# Patient Record
Sex: Male | Born: 1990 | Race: Asian | Hispanic: Yes | Marital: Single | Smoking: Never smoker
Health system: Southern US, Community
[De-identification: ages and names within clinical notes are randomized; demographics above are authoritative.]

## PROBLEM LIST (undated history)

## (undated) HISTORY — PX: NO PAST SURGERIES: SHX2092

---

## 2020-10-25 ENCOUNTER — Ambulatory Visit (INDEPENDENT_AMBULATORY_CARE_PROVIDER_SITE_OTHER): Payer: Self-pay

## 2020-10-25 ENCOUNTER — Other Ambulatory Visit: Payer: Self-pay

## 2020-10-25 ENCOUNTER — Ambulatory Visit
Admission: EM | Admit: 2020-10-25 | Discharge: 2020-10-25 | Disposition: A | Discharge status: Home / Self Care | Payer: Self-pay | Attending: Emergency Medicine | Admitting: Emergency Medicine

## 2020-10-25 DIAGNOSIS — M952 Other acquired deformity of head: Secondary | ICD-10-CM

## 2020-10-25 DIAGNOSIS — S022XXA Fracture of nasal bones, initial encounter for closed fracture: Secondary | ICD-10-CM

## 2020-10-25 DIAGNOSIS — M950 Acquired deformity of nose: Secondary | ICD-10-CM

## 2020-10-25 DIAGNOSIS — S0083XA Contusion of other part of head, initial encounter: Secondary | ICD-10-CM
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
## 2020-10-25 NOTE — Discharge Instructions (Addendum)
X-rays today showed a broken nose.  Use over-the-counter ibuprofen according to the package instructions as needed for pain and swelling.  Do not blow your nose with any force.  Once the swelling has gone down you can follow-up with an ear, nose, and throat specialist to straighten your nose if you wish.
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
